# Patient Record
Sex: Female | Born: 2000 | Race: White | Hispanic: No | Marital: Single | Smoking: Current every day smoker
Health system: Southern US, Community
[De-identification: ages and names within clinical notes are randomized; demographics above are authoritative.]

## PROBLEM LIST (undated history)

## (undated) DIAGNOSIS — Z789 Other specified health status: Secondary | ICD-10-CM

---
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
## 2022-08-14 NOTE — Progress Notes (Unsigned)
[facility]           [place]  History and Physical   Stacy Hickman is a 22 y.o. female who presents with BLE leg swelling with left worse than right.  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  She states that she has swelling in her feet.  When she worked at the ER, she was wearing compression but has not since then.  She states the swelling is improved after a night's rest in bed. She states there is strong family hx of varicose veins.  She does not have any skin color changes in her legs.   The pt is not on a statin for cholesterol management.  The pt is not on a daily aspirin.   Other AC:  none The pt is not on medication for hypertension.   The pt is not diabetic.   Tobacco hx:  never   PMH:  -leg swelling -L1 fracture  PSH: Spinal fusion  Social History   Socioeconomic History   Marital status: Single    Spouse name: Not on file   Number of children: Not on file   Years of education: Not on file   Highest education level: Not on file  Occupational History   Not on file  Tobacco Use   Smoking status: Never   Smokeless tobacco: Never  Substance and Sexual Activity   Alcohol use: Never   Drug use: Never   Sexual activity: Not on file  Other Topics Concern   Not on file  Social History Narrative   Not on file   Social Determinants of Health   Financial Resource Strain: Not on file  Food Insecurity: Not on file  Transportation Needs: Not on file  Physical Activity: Not on file  Stress: Not on file  Social Connections: Not on file  Intimate Partner Violence: Not on file    Family Hx:  + varicose veins  REVIEW OF SYSTEMS:   [X]  denotes positive finding, [ ]  denotes negative finding Cardiac  Comments:  Chest pain or  chest pressure:    Shortness of breath upon exertion:    Short of breath when lying flat:    Irregular heart rhythm:        Vascular    Pain in calf, thigh, or hip brought on by ambulation:    Pain in feet at night that wakes you up from your sleep:     Blood clot in your veins:    Leg swelling:  x       Pulmonary    Oxygen at home:    Productive cough:     Wheezing:         Neurologic    Sudden weakness in arms or legs:     Sudden numbness in arms or legs:     Sudden onset of difficulty speaking or slurred speech:    Temporary loss of vision in one eye:     Problems with dizziness:         Gastrointestinal    Blood in stool:     Vomited blood:         Genitourinary    Burning when urinating:  Blood in urine:        Psychiatric    Major depression:         Hematologic    Bleeding problems:    Problems with blood clotting too easily:        Skin    Rashes or ulcers:        Constitutional    Fever or chills:      PHYSICAL EXAMINATION:  Today's Vitals   08/15/22 1233  BP: 135/84  Pulse: (!) 110  Resp: 20  Temp: 98 F (36.7 C)  TempSrc: Temporal  SpO2: 98%  Weight: 127 lb 4.8 oz (57.7 kg)  Height: 5\' 2"  (1.575 m)  PainSc: 4    Body mass index is 23.28 kg/m.   General:  WDWN in NAD; vital signs documented above Gait: Not observed HENT: WNL, normocephalic Pulmonary: normal non-labored breathing without wheezing Cardiac: regular HR; without carotid bruits Abdomen: soft, NT, aortic pulse is not palpable Skin: without rashes Vascular Exam/Pulses:  Right Left  Radial 2+ (normal) 2+ (normal)  DP 2+ (normal) 2+ (normal)  PT 2+ (normal) 2+ (normal)   Extremities: mild BLE swelling without skin color changes  Neurologic: A&O X 3;  moving all extremities equally Psychiatric:  The pt has Normal affect.   Non-Invasive Vascular Imaging:   Venous duplex on 08/15/2022: +--------------+---------+------+-----------+------------+--------+  LEFT          Reflux NoRefluxReflux TimeDiameter cmsComments                          Yes                                   +--------------+---------+------+-----------+------------+--------+  CFV                    yes   >1 second                       +--------------+---------+------+-----------+------------+--------+  FV mid                  yes   >1 second                       +--------------+---------+------+-----------+------------+--------+  Popliteal    no                                              +--------------+---------+------+-----------+------------+--------+  GSV at SFJ              yes    >500 ms      0.61              +--------------+---------+------+-----------+------------+--------+  GSV prox thigh          yes    >500 ms      0.72              +--------------+---------+------+-----------+------------+--------+  GSV mid thigh           yes    >500 ms      0.38              +--------------+---------+------+-----------+------------+--------+  GSV dist thighno  0.44              +--------------+---------+------+-----------+------------+--------+  GSV at knee   no                            0.38              +--------------+---------+------+-----------+------------+--------+  GSV prox calf no                            0.29              +--------------+---------+------+-----------+------------+--------+  GSV mid calf  no                            0.21              +--------------+---------+------+-----------+------------+--------+  SSV Pop Fossa no                            0.22              +--------------+---------+------+-----------+------------+--------+  SSV prox calf no                            0.14              +--------------+---------+------+-----------+------------+--------+  SSV mid calf  no                            0.16               +--------------+---------+------+-----------+------------+--------+  AASV O        no                            0.38              +--------------+---------+------+-----------+------------+--------+  AASV p        no                            0.30              +--------------+---------+------+-----------+------------+--------+    Summary:  Left:  - No evidence of deep vein thrombosis seen in the left lower extremity, from the common femoral through the popliteal veins.  - No evidence of superficial venous reflux seen in the left short saphenous vein.  - Venous reflux is noted in the left common femoral vein.  - Venous reflux is noted in the left sapheno-femoral junction.  - Venous reflux is noted in the left greater saphenous vein in the thigh.  - Venous reflux is noted in the left femoral vein.     Evelynne Marielle Mantione is a 22 y.o. female who presents with: BLE with left worse than right    -pt has easily palpable pedal pulses bilaterally -pt does not have evidence of DVT.  Pt does have venous reflux on the left in the deep venous system in the CFV and FV as well as the GSV at the [facility] and GSV in the proximal and mid thigh -discussed with pt about wearing knee high 15-20 mmHg compression stockings and pt was measured for these today.    -discussed the importance of leg elevation  and how to elevate properly - pt is advised to elevate their legs and a diagram is given to them to demonstrate for pt to lay flat on their back with knees elevated and slightly bent with their feet higher than their knees, which puts their feet higher than their heart for 15 minutes per day.  If pt cannot lay flat, advised to lay as flat as possible.  -pt is advised to continue as much walking as possible and avoid sitting or standing for long periods of time.  -discussed importance of  exercise and that water aerobics would also be beneficial.  -handout with recommendations given -pt will f/u  as needed.  She knows that if she has worsening symptoms, she can call and follow up. -discussed importance of quitting vaping with PAD, stroke, CAD and also with using BCP and higher risk of blood clots.    Leontine Locket, [facility] [phone]  Clinic MD:  Scot Dock

## 2022-08-15 ENCOUNTER — Other Ambulatory Visit: Payer: Self-pay | Admitting: *Deleted

## 2022-08-15 ENCOUNTER — Ambulatory Visit (HOSPITAL_COMMUNITY)
Admission: RE | Admit: 2022-08-15 | Discharge: 2022-08-15 | Disposition: A | Discharge status: Home / Self Care | Payer: BC Managed Care – PPO | Source: Ambulatory Visit | Attending: Vascular Surgery | Admitting: Vascular Surgery

## 2022-08-15 ENCOUNTER — Ambulatory Visit (INDEPENDENT_AMBULATORY_CARE_PROVIDER_SITE_OTHER): Payer: BC Managed Care – PPO | Admitting: Physician Assistant

## 2022-08-15 VITALS — BP 135/84 | HR 110 | Temp 98.0°F | Resp 20 | Ht 62.0 in | Wt 127.3 lb

## 2022-08-15 DIAGNOSIS — I776 Arteritis, unspecified: Secondary | ICD-10-CM | POA: Insufficient documentation

## 2022-08-15 DIAGNOSIS — M7989 Other specified soft tissue disorders: Secondary | ICD-10-CM

## 2022-09-27 ENCOUNTER — Other Ambulatory Visit: Payer: Self-pay

## 2022-09-27 ENCOUNTER — Emergency Department (HOSPITAL_COMMUNITY): Payer: BC Managed Care – PPO

## 2022-09-27 ENCOUNTER — Encounter (HOSPITAL_COMMUNITY): Payer: Self-pay

## 2022-09-27 ENCOUNTER — Emergency Department (HOSPITAL_COMMUNITY)
Admission: EM | Admit: 2022-09-27 | Discharge: 2022-09-27 | Disposition: A | Discharge status: Home / Self Care | Payer: BC Managed Care – PPO | Attending: Emergency Medicine | Admitting: Emergency Medicine

## 2022-09-27 DIAGNOSIS — R102 Pelvic and perineal pain: Secondary | ICD-10-CM

## 2022-09-27 DIAGNOSIS — R1031 Right lower quadrant pain: Secondary | ICD-10-CM | POA: Diagnosis not present

## 2022-09-27 HISTORY — DX: Other specified health status: Z78.9

## 2022-09-27 LAB — COMPREHENSIVE METABOLIC PANEL
ALT: 21 U/L (ref 0–44)
AST: 22 U/L (ref 15–41)
Albumin: 4.7 g/dL (ref 3.5–5.0)
Alkaline Phosphatase: 49 U/L (ref 38–126)
Anion gap: 13 (ref 5–15)
BUN: 11 mg/dL (ref 6–20)
CO2: 24 mmol/L (ref 22–32)
Calcium: 9.5 mg/dL (ref 8.9–10.3)
Chloride: 99 mmol/L (ref 98–111)
Creatinine, Ser: 0.67 mg/dL (ref 0.44–1.00)
GFR, Estimated: >60 mL/min (ref >60)
Glucose, Bld: 99 mg/dL (ref 70–99)
Potassium: 3.1 mmol/L — ABNORMAL LOW (ref 3.5–5.1)
Sodium: 136 mmol/L (ref 135–145)
Total Bilirubin: 1 mg/dL (ref 0.3–1.2)
Total Protein: 8.7 g/dL — ABNORMAL HIGH (ref 6.5–8.1)

## 2022-09-27 LAB — URINALYSIS, ROUTINE W REFLEX MICROSCOPIC
Bilirubin Urine: NEGATIVE
Glucose, UA: NEGATIVE mg/dL
Hgb urine dipstick: NEGATIVE
Ketones, ur: NEGATIVE mg/dL
Leukocytes,Ua: NEGATIVE
Nitrite: NEGATIVE
Protein, ur: NEGATIVE mg/dL
Specific Gravity, Urine: 1.011 (ref 1.005–1.030)
pH: 6 (ref 5.0–8.0)

## 2022-09-27 LAB — CBC WITH DIFFERENTIAL/PLATELET
Abs Immature Granulocytes: 0.02 10*3/uL (ref 0.00–0.07)
Basophils Absolute: 0.1 10*3/uL (ref 0.0–0.1)
Basophils Relative: 1 %
Eosinophils Absolute: 0.1 10*3/uL (ref 0.0–0.5)
Eosinophils Relative: 1 %
HCT: 43.6 % (ref 36.0–46.0)
Hemoglobin: 14.6 g/dL (ref 12.0–15.0)
Immature Granulocytes: 0 %
Lymphocytes Relative: 37 %
Lymphs Abs: 3 10*3/uL (ref 0.7–4.0)
MCH: 30 pg (ref 26.0–34.0)
MCHC: 33.5 g/dL (ref 30.0–36.0)
MCV: 89.7 fL (ref 80.0–100.0)
Monocytes Absolute: 0.4 10*3/uL (ref 0.1–1.0)
Monocytes Relative: 5 %
Neutro Abs: 4.6 10*3/uL (ref 1.7–7.7)
Neutrophils Relative %: 56 %
Platelets: 329 10*3/uL (ref 150–400)
RBC: 4.86 MIL/uL (ref 3.87–5.11)
RDW: 12.1 % (ref 11.5–15.5)
WBC: 8.2 10*3/uL (ref 4.0–10.5)
nRBC: 0 % (ref 0.0–0.2)

## 2022-09-27 LAB — PREGNANCY, URINE: Preg Test, Ur: NEGATIVE

## 2022-09-27 LAB — HCG, QUANTITATIVE, PREGNANCY: hCG, Beta Chain, Quant, S: <1 m[IU]/mL (ref <5)

## 2022-09-27 MED ORDER — IBUPROFEN 800 MG PO TABS: 800.0000 mg | ORAL_TABLET | Freq: Three times a day (TID) | ORAL | 0 refills | Status: AC | PRN

## 2022-09-27 MED ORDER — ONDANSETRON HCL 4 MG/2ML IJ SOLN
4.0000 mg | Freq: Once | INTRAMUSCULAR | Status: AC
  Administered 2022-09-27: 4 mg via INTRAVENOUS
  Filled 2022-09-27: qty 2

## 2022-09-27 MED ORDER — HYDROMORPHONE HCL 1 MG/ML IJ SOLN
0.5000 mg | Freq: Once | INTRAMUSCULAR | Status: AC
  Administered 2022-09-27: 0.5 mg via INTRAVENOUS
  Filled 2022-09-27: qty 0.5

## 2022-09-27 MED ORDER — KETOROLAC TROMETHAMINE 30 MG/ML IJ SOLN
30.0000 mg | Freq: Once | INTRAMUSCULAR | Status: AC
  Administered 2022-09-27: 30 mg via INTRAVENOUS
  Filled 2022-09-27: qty 1

## 2022-09-27 NOTE — ED Triage Notes (Signed)
Complaining of pain in the rt lower abdomen that started last night and is getting worse.

## 2022-09-27 NOTE — ED Notes (Signed)
Patient transported to Ultrasound 

## 2022-09-27 NOTE — ED Notes (Signed)
Patient complains of right lower quadrant pain that radiates to right side. Patient denies dysuria, pain. Urine clear in color with no odor.

## 2022-09-27 NOTE — ED Notes (Signed)
Pt return from CT.

## 2022-09-27 NOTE — ED Provider Notes (Signed)
[place] Provider Note   CSN: [id number] Arrival date & time: 09/27/22  1759     History {Add pertinent medical, surgical, social history, OB history to HPI:1} Chief Complaint  Patient presents with   Abdominal Pain    Stacy Hickman is a 22 y.o. female.  Patient complains of right lower quadrant pain.  Patient has a history of L1 fracture   Abdominal Pain      Home Medications Prior to Admission medications   Medication Sig Start Date End Date Taking? Authorizing Provider  ibuprofen (ADVIL) 800 MG tablet Take 1 tablet (800 mg total) by mouth every 8 (eight) hours as needed for moderate pain. 09/27/22  Yes Milton Ferguson, MD  Melatonin 1 MG CHEW Chew 2 tablets by mouth at bedtime.   Yes [provider]  Lewis 28 0.25-35 MG-MCG tablet Take 1 tablet by mouth daily. 08/01/22  Yes [provider]      Allergies    Patient has no known allergies.    Review of Systems   Review of Systems  Gastrointestinal:  Positive for abdominal pain.    Physical Exam Updated Vital Signs BP 125/72   Pulse 86   Temp 99 F (37.2 C) (Oral)   Resp 20   Ht 5' 2"$  (1.575 m)   Wt 59 kg   LMP 09/27/2022   SpO2 100%   BMI 23.78 kg/m  Physical Exam  ED Results / Procedures / Treatments   Labs (all labs ordered are listed, but only abnormal results are displayed) Labs Reviewed  URINALYSIS, ROUTINE W REFLEX MICROSCOPIC - Abnormal; Notable for the following components:      Result Value   Color, Urine STRAW (*)    All other components within normal limits  COMPREHENSIVE METABOLIC PANEL - Abnormal; Notable for the following components:   Potassium 3.1 (*)    Total Protein 8.7 (*)    All other components within normal limits  CBC WITH DIFFERENTIAL/PLATELET  HCG, QUANTITATIVE, PREGNANCY  PREGNANCY, URINE    EKG None  Radiology US PELVIC COMPLETE W TRANSVAGINAL AND TORSION R/O  Result Date:  09/27/2022 CLINICAL DATA:  Pelvic pain EXAM: TRANSABDOMINAL AND TRANSVAGINAL ULTRASOUND OF PELVIS DOPPLER ULTRASOUND OF OVARIES TECHNIQUE: Both transabdominal and transvaginal ultrasound examinations of the pelvis were performed. Transabdominal technique was performed for global imaging of the pelvis including uterus, ovaries, adnexal regions, and pelvic cul-de-sac. It was necessary to proceed with endovaginal exam following the transabdominal exam to visualize the uterus endometrium adnexa. Color and duplex Doppler ultrasound was utilized to evaluate blood flow to the ovaries. COMPARISON:  CT 09/27/2022 FINDINGS: Uterus Measurements: 7.8 x 2.8 x 4 cm = volume: 45 mL. No fibroids or other mass visualized. Endometrium Thickness: 2 mm.  No focal abnormality visualized. Right ovary Measurements: 2.6 x 1.3 x 2.3 cm = volume: 3.9 mL. Normal appearance/no adnexal mass. Left ovary Not seen Pulsed Doppler evaluation of the right ovary demonstrates normal low-resistance arterial and venous waveforms. Other findings No abnormal free fluid. IMPRESSION: Nonvisualized left ovary. Otherwise negative pelvic ultrasound. Electronically Signed   By: Donavan Foil M.D.   On: 09/27/2022 21:50   CT Renal Stone Study  Result Date: 09/27/2022 CLINICAL DATA:  Abdomen and flank pain EXAM: CT ABDOMEN AND PELVIS WITHOUT CONTRAST TECHNIQUE: Multidetector CT imaging of the abdomen and pelvis was performed following the standard protocol without IV contrast. RADIATION DOSE REDUCTION: This exam was performed according to the departmental dose-optimization program which  includes automated exposure control, adjustment of the mA and/or kV according to patient size and/or use of iterative reconstruction technique. COMPARISON:  CT 08/03/2018 FINDINGS: Lower chest: No acute abnormality. Hepatobiliary: No focal liver abnormality is seen. No gallstones, gallbladder wall thickening, or biliary dilatation. Pancreas: Unremarkable. No pancreatic ductal  dilatation or surrounding inflammatory changes. Spleen: Normal in size without focal abnormality. Adrenals/Urinary Tract: Adrenal glands are unremarkable. Kidneys are normal, without renal calculi, focal lesion, or hydronephrosis. Bladder is unremarkable. Stomach/Bowel: Stomach is within normal limits. Appendix appears normal. No evidence of bowel wall thickening, distention, or inflammatory changes. Vascular/Lymphatic: No significant vascular findings are present. No enlarged abdominal or pelvic lymph nodes. Reproductive: Uterus and bilateral adnexa are unremarkable. Other: No abdominal wall hernia or abnormality. No abdominopelvic ascites. Musculoskeletal: Spinal rods and transpedicular screws T12 through L2. Old fracture deformity at L1. IMPRESSION: 1. No evidence of nephrolithiasis or hydronephrosis. 2. No CT evidence for acute intra-abdominal or pelvic abnormality 3. Old fracture deformity at L1 with spinal rods and transpedicular screws T12 through L2. Electronically Signed   By: Donavan Foil M.D.   On: 09/27/2022 20:04    Procedures Procedures  {Document cardiac monitor, telemetry assessment procedure when appropriate:1}  Medications Ordered in ED Medications  ketorolac (TORADOL) 30 MG/ML injection 30 mg (30 mg Intravenous Given 09/27/22 1850)  ondansetron (ZOFRAN) injection 4 mg (4 mg Intravenous Given 09/27/22 1850)  HYDROmorphone (DILAUDID) injection 0.5 mg (0.5 mg Intravenous Given 09/27/22 2038)    ED Course/ Medical Decision Making/ A&P   {Labs and CT scan and ultrasound are negative. Click here for ABCD2, HEART and other calculatorsREFRESH Note before signing :1}                          Medical Decision Making Amount and/or Complexity of Data Reviewed Labs: ordered. Radiology: ordered.  Risk Prescription drug management.   Patient with pelvic pain.  She declined a pelvic exam for now.  She was sent home with some Motrin and follow-up with her PCP  {Document critical care  time when appropriate:1} {Document review of labs and clinical decision tools ie heart score, Chads2Vasc2 etc:1}  {Document your independent review of radiology images, and any outside records:1} {Document your discussion with family members, caretakers, and with consultants:1} {Document social determinants of health affecting pt's care:1} {Document your decision making why or why not admission, treatments were needed:1} Final Clinical Impression(s) / ED Diagnoses Final diagnoses:  Pelvic pain in female    Rx / DC Orders ED Discharge Orders          Ordered    ibuprofen (ADVIL) 800 MG tablet  Every 8 hours PRN        09/27/22 2200

## 2022-09-27 NOTE — Discharge Instructions (Signed)
Follow-up with your doctor next week for recheck if you have continued pain
